# Patient Record
Sex: Male | Born: 1950 | Smoking: Former smoker
Health system: Southern US, Community
[De-identification: ages and names within clinical notes are randomized; demographics above are authoritative.]

## PROBLEM LIST (undated history)

## (undated) DIAGNOSIS — H409 Unspecified glaucoma: Secondary | ICD-10-CM

## (undated) DIAGNOSIS — M179 Osteoarthritis of knee, unspecified: Secondary | ICD-10-CM

## (undated) DIAGNOSIS — F329 Major depressive disorder, single episode, unspecified: Secondary | ICD-10-CM

## (undated) DIAGNOSIS — F32A Depression, unspecified: Secondary | ICD-10-CM

## (undated) DIAGNOSIS — Z9989 Dependence on other enabling machines and devices: Secondary | ICD-10-CM

## (undated) DIAGNOSIS — G4733 Obstructive sleep apnea (adult) (pediatric): Secondary | ICD-10-CM

## (undated) DIAGNOSIS — I10 Essential (primary) hypertension: Secondary | ICD-10-CM

## (undated) DIAGNOSIS — Z96651 Presence of right artificial knee joint: Secondary | ICD-10-CM

## (undated) DIAGNOSIS — E785 Hyperlipidemia, unspecified: Secondary | ICD-10-CM

## (undated) DIAGNOSIS — M1710 Unilateral primary osteoarthritis, unspecified knee: Secondary | ICD-10-CM

## (undated) DIAGNOSIS — K5900 Constipation, unspecified: Secondary | ICD-10-CM

## (undated) HISTORY — DX: Presence of right artificial knee joint: Z96.651

## (undated) HISTORY — DX: Major depressive disorder, single episode, unspecified: F32.9

## (undated) HISTORY — DX: Unspecified glaucoma: H40.9

## (undated) HISTORY — DX: Hyperlipidemia, unspecified: E78.5

## (undated) HISTORY — DX: Dependence on other enabling machines and devices: Z99.89

## (undated) HISTORY — DX: Osteoarthritis of knee, unspecified: M17.9

## (undated) HISTORY — DX: Obstructive sleep apnea (adult) (pediatric): G47.33

## (undated) HISTORY — DX: Depression, unspecified: F32.A

## (undated) HISTORY — DX: Constipation, unspecified: K59.00

## (undated) HISTORY — DX: Essential (primary) hypertension: I10

## (undated) HISTORY — DX: Unilateral primary osteoarthritis, unspecified knee: M17.10

---

## 2013-10-18 DIAGNOSIS — Z9989 Dependence on other enabling machines and devices: Secondary | ICD-10-CM

## 2013-10-18 DIAGNOSIS — G4733 Obstructive sleep apnea (adult) (pediatric): Secondary | ICD-10-CM

## 2013-10-18 HISTORY — DX: Obstructive sleep apnea (adult) (pediatric): G47.33

## 2016-11-25 DIAGNOSIS — Z96651 Presence of right artificial knee joint: Secondary | ICD-10-CM | POA: Insufficient documentation

## 2016-11-25 HISTORY — DX: Presence of right artificial knee joint: Z96.651

## 2016-11-25 HISTORY — PX: TOTAL KNEE ARTHROPLASTY: SHX125

## 2016-11-28 ENCOUNTER — Non-Acute Institutional Stay (SKILLED_NURSING_FACILITY): Payer: Medicare Other | Admitting: Internal Medicine

## 2016-11-28 DIAGNOSIS — Z96651 Presence of right artificial knee joint: Secondary | ICD-10-CM

## 2016-11-28 DIAGNOSIS — I10 Essential (primary) hypertension: Secondary | ICD-10-CM

## 2016-11-28 DIAGNOSIS — M1711 Unilateral primary osteoarthritis, right knee: Secondary | ICD-10-CM | POA: Diagnosis not present

## 2016-11-28 DIAGNOSIS — Z9989 Dependence on other enabling machines and devices: Secondary | ICD-10-CM

## 2016-11-28 DIAGNOSIS — H409 Unspecified glaucoma: Secondary | ICD-10-CM

## 2016-11-28 DIAGNOSIS — F32A Depression, unspecified: Secondary | ICD-10-CM

## 2016-11-28 DIAGNOSIS — F329 Major depressive disorder, single episode, unspecified: Secondary | ICD-10-CM

## 2016-11-28 DIAGNOSIS — G4733 Obstructive sleep apnea (adult) (pediatric): Secondary | ICD-10-CM | POA: Diagnosis not present

## 2016-11-28 DIAGNOSIS — E785 Hyperlipidemia, unspecified: Secondary | ICD-10-CM | POA: Diagnosis not present

## 2016-12-01 ENCOUNTER — Encounter: Payer: Self-pay | Admitting: Internal Medicine

## 2016-12-01 DIAGNOSIS — M1710 Unilateral primary osteoarthritis, unspecified knee: Secondary | ICD-10-CM | POA: Insufficient documentation

## 2016-12-01 DIAGNOSIS — H409 Unspecified glaucoma: Secondary | ICD-10-CM | POA: Insufficient documentation

## 2016-12-01 DIAGNOSIS — F329 Major depressive disorder, single episode, unspecified: Secondary | ICD-10-CM | POA: Insufficient documentation

## 2016-12-01 DIAGNOSIS — F32A Depression, unspecified: Secondary | ICD-10-CM | POA: Insufficient documentation

## 2016-12-01 DIAGNOSIS — M179 Osteoarthritis of knee, unspecified: Secondary | ICD-10-CM | POA: Insufficient documentation

## 2016-12-01 DIAGNOSIS — E785 Hyperlipidemia, unspecified: Secondary | ICD-10-CM | POA: Insufficient documentation

## 2016-12-01 DIAGNOSIS — I10 Essential (primary) hypertension: Secondary | ICD-10-CM | POA: Insufficient documentation

## 2016-12-01 HISTORY — DX: Hyperlipidemia, unspecified: E78.5

## 2016-12-01 HISTORY — DX: Unspecified glaucoma: H40.9

## 2016-12-01 NOTE — Progress Notes (Signed)
: Provider:  Randon Goldsmith. Lyn Hollingshead, MD Location:  [facility] Room Number: 111 Place of Service:  SNF ([phone])  PCP: No primary care provider on file. Patient Care Team: Marguarite Arbour, MD as Consulting Physician (Orthopedic Surgery)  Extended Emergency Contact Information Primary Emergency Contact: Georg [place] Relation: Sister     Allergies: Patient has no known allergies.  Chief Complaint  Patient presents with  . New Admit To SNF    following hospitalization 11/25/16 to 11/28/16 [facility] right total knee arthroplasty, Dr. Barkley Boards    HPI: Patient is 66 y.o. male end-stage osteoarthritis right knee, hypertension, OSA on CPAP, hyperlipidemia, and depression who was admitted to [facility] from 10 9/12 for a planned right total knee arthroplasty secondary to end-stage osteoarthritis of the right knee. Information on patient is extremely sparse practically nothing on the discharge summary and there is no care everywhere. Patient left on postoperative day 3 and is on aspirin twice a day as prophylaxis. Patient is admitted to skilled nursing facility here in [place] because he needs to be near family for OT/PT. While at skilled nursing facility patient will be followed for hyperlipidemia treated with Lipitor hypertension treated with lisinopril and depression treated with Zoloft.  Past Medical History:  Diagnosis Date  . Constipation   . Depression   . Hypertension   . OSA on CPAP 10/2013  . Osteoarthritis of knee   . 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 TOTAL KNEE ARTHROPLASTY Right 11/25/2016   Dr. Barkley Boards    Allergies as of 11/28/2016   Not on File     Medication List       Accurate as of 11/28/16 11:59 PM. Always use your most recent med list.          acetaminophen 325 MG tablet Commonly known as:  TYLENOL Take  325 mg by mouth. Take 2 tablets every 6 hours for pain   aspirin 325 MG tablet Take 325 mg by mouth. Take one tablet twice daily with meals for 6 weeks for osteoarthritis s/p right TKA. Stop 01/09/17   atorvastatin 20 MG tablet Commonly known as:  LIPITOR Take 20 mg by mouth. Take one tablet at bedtime for hyperlipidemia   latanoprost 0.005 % ophthalmic solution Commonly known as:  XALATAN 1 drop. In both eyes at bedtime for glaucoma   lisinopril 40 MG tablet Commonly known as:  PRINIVIL,ZESTRIL Take 40 mg by mouth. Take one tablet daily for hypertension   meloxicam 7.5 MG tablet Commonly known as:  MOBIC Take 7.5 mg by mouth. Take one tablet with lunch for pain/inflammation   oxyCODONE 5 MG immediate release tablet Commonly known as:  Oxy IR/ROXICODONE Take 5 mg by mouth. Take one tablet every 4 hours as needed for mild to moderate pain. Take 2 tablets every 4 hours as needed for severe pain. Stop 12/12/16   senna 8.6 MG Tabs tablet Commonly known as:  SENOKOT Take 1 tablet by mouth. Take 2 tablets twice daily for constipation   sertraline 50 MG tablet Commonly known as:  ZOLOFT Take 50 mg by mouth. Take one tablet daily for depression   timolol 0.5 % ophthalmic solution Commonly known as:  BETIMOL 1 drop. In right eye once daily for glaucoma       Meds ordered this encounter  Medications  . timolol (BETIMOL) 0.5 % ophthalmic solution  Sig: 1 drop. In right eye once daily for glaucoma  . sertraline (ZOLOFT) 50 MG tablet    Sig: Take 50 mg by mouth. Take one tablet daily for depression  . oxyCODONE (OXY IR/ROXICODONE) 5 MG immediate release tablet    Sig: Take 5 mg by mouth. Take one tablet every 4 hours as needed for mild to moderate pain. Take 2 tablets every 4 hours as needed for severe pain. Stop 12/12/16  . meloxicam (MOBIC) 7.5 MG tablet    Sig: Take 7.5 mg by mouth. Take one tablet with lunch for pain/inflammation  . lisinopril (PRINIVIL,ZESTRIL) 40 MG  tablet    Sig: Take 40 mg by mouth. Take one tablet daily for hypertension  . latanoprost (XALATAN) 0.005 % ophthalmic solution    Sig: 1 drop. In both eyes at bedtime for glaucoma  . senna (SENOKOT) 8.6 MG TABS tablet    Sig: Take 1 tablet by mouth. Take 2 tablets twice daily for constipation  . atorvastatin (LIPITOR) 20 MG tablet    Sig: Take 20 mg by mouth. Take one tablet at bedtime for hyperlipidemia  . aspirin 325 MG tablet    Sig: Take 325 mg by mouth. Take one tablet twice daily with meals for 6 weeks for osteoarthritis s/p right TKA. Stop 01/09/17  . acetaminophen (TYLENOL) 325 MG tablet    Sig: Take 325 mg by mouth. Take 2 tablets every 6 hours for pain     There is no immunization history on file for this patient.  Social History  Substance Use Topics  . Smoking status: Former Smoker    Types: Cigarettes  . Smokeless tobacco: Never Used  . Alcohol use No    Family history is   Family History  Problem Relation Age of Onset  . Cancer Mother   . Cancer Father   . Heart disease Maternal Grandmother   . Heart disease Paternal Grandmother       Review of Systems  DATA OBTAINED: from patient GENERAL:  no fevers, fatigue, appetite changes SKIN: No itching, or rash EYES: No eye pain, redness, discharge EARS: No earache, tinnitus, change in hearing NOSE: No congestion, drainage or bleeding  MOUTH/THROAT: No mouth or tooth pain, No sore throat RESPIRATORY: No cough, wheezing, SOB CARDIAC: No chest pain, palpitations, lower extremity edema  GI: No abdominal pain, No N/V/D or constipation, No heartburn or reflux  GU: No dysuria, frequency or urgency, or incontinence  MUSCULOSKELETAL: No unrelieved bone/joint pain NEUROLOGIC: No headache, dizziness or focal weakness PSYCHIATRIC: No c/o anxiety or sadness   Vitals:   11/28/16 1003  BP: 138/74  Pulse: 76  Resp: 19  Temp: 98.4 F (36.9 C)    SpO2 Readings from Last 1 Encounters:  No data found for SpO2   Body  mass index is 37.91 kg/m.     Physical Exam  GENERAL APPEARANCE: Alert, conversant,  No acute distress.  SKIN: No unexpected warmth or erythema; swelling is borderline HEAD: Normocephalic, atraumatic  EYES: Conjunctiva/lids clear. Pupils round, reactive. EOMs intact.  EARS: External exam WNL, canals clear. Hearing grossly normal.  NOSE: No deformity or discharge.  MOUTH/THROAT: Lips w/o lesions  RESPIRATORY: Breathing is even, unlabored. Lung sounds are clear   CARDIOVASCULAR: Heart RRR no murmurs, rubs or gallops. Right lower extremity is greater in size than left lower extremity which is not unusual for status post surgery   GASTROINTESTINAL: Abdomen is soft, non-tender, not distended w/ normal bowel sounds. GENITOURINARY: Bladder non tender, not distended  MUSCULOSKELETAL: No abnormal joints or musculature NEUROLOGIC:  Cranial nerves 2-12 grossly intact. Moves all extremities  PSYCHIATRIC: Mood and affect appropriate to situation, no behavioral issues  Patient Active Problem List   Diagnosis Date Noted  . Osteoarthritis of knee 12/01/2016  . Hypertension 12/01/2016  . Depression 12/01/2016  . Hyperlipidemia 12/01/2016  . Glaucoma 12/01/2016  . S/P total knee arthroplasty, right 11/25/2016  . OSA on CPAP 10/18/2013      Labs reviewed: Basic Metabolic Panel: No results found for: NA, K, CL, CO2, GLUCOSE, BUN, CREATININE, CALCIUM, PROT, ALBUMIN, AST, ALT, ALKPHOS, BILITOT, GFRNONAA, GFRAA  No results for input(s): NA, K, CL, CO2, GLUCOSE, BUN, CREATININE, CALCIUM, MG, PHOS in the last 8760 hours. Liver Function Tests: No results for input(s): AST, ALT, ALKPHOS, BILITOT, PROT, ALBUMIN in the last 8760 hours. No results for input(s): LIPASE, AMYLASE in the last 8760 hours. No results for input(s): AMMONIA in the last 8760 hours. CBC: No results for input(s): WBC, NEUTROABS, HGB, HCT, MCV, PLT in the last 8760 hours. Lipid No results for input(s): CHOL, HDL, LDLCALC, TRIG  in the last 8760 hours.  Cardiac Enzymes: No results for input(s): CKTOTAL, CKMB, CKMBINDEX, TROPONINI in the last 8760 hours. BNP: No results for input(s): BNP in the last 8760 hours. No results found for: MICROALBUR No results found for: HGBA1C No results found for: TSH No results found for: VITAMINB12 No results found for: FOLATE No results found for: IRON, TIBC, FERRITIN  Imaging and Procedures obtained prior to SNF admission: Patient was never admitted.   Not all labs, radiology exams or other studies done during hospitalization come through on my EPIC note; however they are reviewed by me.    Assessment and Plan  IN STAGE OSTEOARTHRITIS RIGHT KNEE/status post right knee arthroplasty; performed [facility] and apparently without any complications. Patient is being prophylaxed with ASA twice a day for 6 weeks SNF - admitted for OT/PT; continue ASA 325 mg by mouth twice a day for 6 weeks as prophylaxis  HYPERTENSION SNF - controlled; plan to continue lisinopril 40 mg by mouth daily  OSA CPAP SNF - patient normally uses his CPAP, and hasn't been brought to him yet by family but is coming  HYPERLIPIDEMIA SNF -not stated as uncontrolled; plan to continue Lipitor 20 mg by mouth daily at bedtime  DEPRESSION SNF - appears very well controlled; continue Zoloft 50 mg by mouth daily  GLAUCOMA SNF - controlled; plan to continue timolol 1 drop daily and xalantan 1 drop each eye daily at bedtime  No problem-specific Assessment & Plan notes found for this encounter.  Time spent greater than 45 minutes due to paucity of information Randon Goldsmith. Lyn Hollingshead, MD

## 2016-12-02 LAB — CBC AND DIFFERENTIAL
HEMATOCRIT: 33 — AB (ref 41–53)
HEMOGLOBIN: 10.9 — AB (ref 13.5–17.5)
PLATELETS: 492 — AB (ref 150–399)
WBC: 8.4

## 2016-12-02 LAB — BASIC METABOLIC PANEL
BUN: 13 (ref 4–21)
CREATININE: 0.8 (ref 0.6–1.3)
Glucose: 106
Potassium: 4.2 (ref 3.4–5.3)
Sodium: 137 (ref 137–147)

## 2016-12-12 ENCOUNTER — Other Ambulatory Visit: Payer: Self-pay

## 2016-12-15 ENCOUNTER — Encounter: Payer: Self-pay | Admitting: Internal Medicine

## 2016-12-15 ENCOUNTER — Non-Acute Institutional Stay (SKILLED_NURSING_FACILITY): Payer: Medicare Other | Admitting: Internal Medicine

## 2016-12-15 DIAGNOSIS — E785 Hyperlipidemia, unspecified: Secondary | ICD-10-CM | POA: Diagnosis not present

## 2016-12-15 DIAGNOSIS — H409 Unspecified glaucoma: Secondary | ICD-10-CM

## 2016-12-15 DIAGNOSIS — Z9989 Dependence on other enabling machines and devices: Secondary | ICD-10-CM

## 2016-12-15 DIAGNOSIS — F329 Major depressive disorder, single episode, unspecified: Secondary | ICD-10-CM | POA: Diagnosis not present

## 2016-12-15 DIAGNOSIS — I10 Essential (primary) hypertension: Secondary | ICD-10-CM

## 2016-12-15 DIAGNOSIS — Z96651 Presence of right artificial knee joint: Secondary | ICD-10-CM

## 2016-12-15 DIAGNOSIS — G4733 Obstructive sleep apnea (adult) (pediatric): Secondary | ICD-10-CM | POA: Diagnosis not present

## 2016-12-15 DIAGNOSIS — M1711 Unilateral primary osteoarthritis, right knee: Secondary | ICD-10-CM | POA: Diagnosis not present

## 2016-12-15 DIAGNOSIS — F32A Depression, unspecified: Secondary | ICD-10-CM

## 2016-12-15 NOTE — Progress Notes (Signed)
Location:  [facility] Room Number: 111 Place of Service:  SNF [phone])  Provider: Randon Goldsmith. Lyn Hollingshead, MD  PCP: No primary care provider on file. Patient Care Team: Marguarite Arbour, MD as Consulting Physician (Orthopedic Surgery)  Extended Emergency Contact Information Primary Emergency Contact: Georg [place]uddle [place] Home Phone: [phone] Relation: Sister  No Known Allergies  Chief Complaint  Patient presents with  . Discharge Note    discharge from SNF to home    HPI:  66 y.o. male  With end-stage osteoarthritis of right knee, hypertension, OSA on CPAP, hyperlipidemia, and depression [facility] a planned right total knee arthroplasty.Information on patient was extremely sparse with nothing on the discharge summary and no care everywhere. But it was noted the patient left on postop day 3 and was on aspirin twice a day as prophylaxis. Patient was admitted to skilled nursing facility for OT/PT in [place] because he needed to be near family. Patient is now ready to be discharged to home.    Past Medical History:  Diagnosis Date  . Constipation   . Depression   . Glaucoma 12/01/2016  . Hyperlipidemia 12/01/2016  . Hypertension   . OSA on CPAP 10/2013  . Osteoarthritis of knee   . 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 TOTAL KNEE ARTHROPLASTY Right 11/25/2016   Dr. Barkley Boards     reports that he has quit smoking. His smoking use included cigarettes. he has never used smokeless tobacco. He reports that he does not drink alcohol or use drugs. Social History   Socioeconomic History  . Marital status: Divorced    Spouse name: Not on file  . Number of children: Not on file  . Years of education: Not on file  . Highest education level: Not on file  Social Needs  . Financial resource strain: Not on file  .  Food insecurity - worry: Not on file  . Food insecurity - inability: Not on file  . Transportation needs - medical: Not on file  . Transportation needs - non-medical: Not on file  Occupational History  . Not on file  Tobacco Use  . Smoking status: Former Smoker    Types: Cigarettes  . Smokeless tobacco: Never Used  Substance and Sexual Activity  . Alcohol use: No  . Drug use: No  . Sexual activity: Not Currently  Other Topics Concern  . Not on file  Social History Narrative   Ad[facility] to [facility] 11/28/16   Divorced   Never smoked   Alcohol none   [employer]   Full code    Pertinent  Health Maintenance Due  Topic Date Due  . COLONOSCOPY  11/09/2000  . PNA vac Low Risk Adult (1 of 2 - PCV13) 11/10/2015  . INFLUENZA VACCINE  09/17/2016    Medications: Allergies as of 12/15/2016   No Known Allergies     Medication List        Accurate as of 12/15/16 11:59 PM. Always use your most recent med list.          acetaminophen 325 MG tablet Commonly known as:  TYLENOL Take 325 mg by mouth. Take 2 tablets every 6 hours for pain   aspirin 325 MG tablet Take 325 mg by mouth. Take one tablet twice daily with meals for 6 weeks for osteoarthritis s/p  right TKA. Stop 01/09/17   atorvastatin 20 MG tablet Commonly known as:  LIPITOR Take 20 mg by mouth. Take one tablet at bedtime for hyperlipidemia   latanoprost 0.005 % ophthalmic solution Commonly known as:  XALATAN 1 drop. In both eyes at bedtime for glaucoma   lisinopril 40 MG tablet Commonly known as:  PRINIVIL,ZESTRIL Take 40 mg by mouth. Take one tablet daily for hypertension   meloxicam 7.5 MG tablet Commonly known as:  MOBIC Take 7.5 mg by mouth. Take one tablet with lunch for pain/inflammation   senna 8.6 MG Tabs tablet Commonly known as:  SENOKOT Take 1 tablet by mouth. Take 2 tablets twice daily for constipation   sertraline 50 MG tablet Commonly known as:  ZOLOFT Take 50 mg by mouth. Take  one tablet daily for depression   timolol 0.5 % ophthalmic solution Commonly known as:  BETIMOL 1 drop. In right eye once daily for glaucoma        Vitals:   12/15/16 1109  BP: 110/60  Pulse: 66  Resp: 18  Temp: 97.6 F (36.4 C)  Weight: 255 lb (115.7 kg)  Height: 5\' 9"  (1.753 m)   Body mass index is 37.66 kg/m.  Physical Exam  GENERAL APPEARANCE: Alert, conversant. No acute distress.  HEENT: Unremarkable. RESPIRATORY: Breathing is even, unlabored. Lung sounds are clear   CARDIOVASCULAR: Heart RRR no murmurs, rubs or gallops. No peripheral edema.  GASTROINTESTINAL: Abdomen is soft, non-tender, not distended w/ normal bowel sounds.  NEUROLOGIC: Cranial nerves 2-12 grossly intact. Moves all extremities   Labs reviewed: Basic Metabolic Panel: Recent Labs    12/02/16  NA 137  K 4.2  BUN 13  CREATININE 0.8   No results found for: [facility] Liver Function Tests: No results for input(s): AST, ALT, ALKPHOS, BILITOT, PROT, ALBUMIN in the last 8760 hours. No results for input(s): LIPASE, AMYLASE in the last 8760 hours. No results for input(s): AMMONIA in the last 8760 hours. CBC: Recent Labs    12/02/16  WBC 8.4  HGB 10.9*  HCT 33*  PLT 492*   Lipid No results for input(s): CHOL, HDL, LDLCALC, TRIG in the last 8760 hours. Cardiac Enzymes: No results for input(s): CKTOTAL, CKMB, CKMBINDEX, TROPONINI in the last 8760 hours. BNP: No results for input(s): BNP in the last 8760 hours. CBG: No results for input(s): GLUCAP in the last 8760 hours.  Procedures and Imaging Studies During Stay: No results found.  Assessment/Plan:   Primary osteoarthritis of right knee  S/P total knee arthroplasty, right  Essential hypertension  OSA on CPAP  Depression, unspecified depression type  Hyperlipidemia, unspecified hyperlipidemia type  Glaucoma of both eyes, unspecified glaucoma type   Patient is being [facility] with the following [facility] services[facility]  [facility]  Patient is being discharged with the following durable medical equipment: none   Patient has been advised to f/u with their PCP in 1-2 weeks to bring them up to date on their rehab stay.  Social services at facility was responsible for arranging this appointment.  Pt was provided with a 30 day supply of prescriptions for medications and refills must be obtained from their PCP.  For controlled substances, a more limited supply may be provided adequate until PCP appointment only.  Medications have been reconciled.  Time spent greater than 30 minutes;> 50% of time with patient was spent reviewing records, labs, tests and studies, counseling and developing plan of care  Randon Goldsmithnne D. Lyn HollingsheadAlexander, MD

## 2016-12-21 ENCOUNTER — Encounter: Payer: Self-pay | Admitting: Internal Medicine

## 2020-03-19 ENCOUNTER — Ambulatory Visit (INDEPENDENT_AMBULATORY_CARE_PROVIDER_SITE_OTHER): Payer: Medicare HMO | Admitting: Neurology

## 2020-03-19 ENCOUNTER — Encounter: Payer: Self-pay | Admitting: Neurology

## 2020-03-19 VITALS — BP 162/76 | HR 50 | Ht 70.0 in | Wt 253.0 lb

## 2020-03-19 DIAGNOSIS — F5104 Psychophysiologic insomnia: Secondary | ICD-10-CM | POA: Diagnosis not present

## 2020-03-19 DIAGNOSIS — R0683 Snoring: Secondary | ICD-10-CM

## 2020-03-19 DIAGNOSIS — I10 Essential (primary) hypertension: Secondary | ICD-10-CM

## 2020-03-19 DIAGNOSIS — M2619 Other specified anomalies of jaw-cranial base relationship: Secondary | ICD-10-CM | POA: Diagnosis not present

## 2020-03-19 DIAGNOSIS — G4733 Obstructive sleep apnea (adult) (pediatric): Secondary | ICD-10-CM

## 2020-03-19 DIAGNOSIS — Z9989 Dependence on other enabling machines and devices: Secondary | ICD-10-CM

## 2020-03-19 NOTE — Progress Notes (Signed)
SLEEP MEDICINE CLINIC    Provider:  Melvyn Novas, MD  Primary Care Physician:  [facility]   Referring Provider: [facility]-  Dr Charlott Holler.        Chief Complaint according to patient   Patient presents with:    . New Patient (Initial Visit)           HISTORY OF PRESENT ILLNESS:  Christian Nicholson is a 70 year old African -American  male patient and seen upon a [place] referral on 03/19/2020.  Chief concern according to patient :  re-evauation of sleep apnea and insomnia. Insomnia ever since childhood.  I could not easily fall asleep ever, not in childhood. 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 [place].   I have the pleasure of seeing [employer] today, a right -handed [facility] patient with OSA sleep disorder.  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 He didn't find supplies - but used his machine.     The patient had the first sleep study in the year 2017 or 18  with a result of an AHI ( Apnea Hypopnea index)  Enough to s diagnosis OSA.  he is using a phillips machine on recall- he is waiting a replacement. Never used a so-clean.  He is interested in a dental device.  I was able to download the DreamStation auto CPAP which the patient has received in 2017 or 2018.  Over the last 30 days he has used the device every day 100% with an average use at time of 8 hours 25 minutes which is excellent.  His CPAP device left him with an average residual AHI of 3.0 which is also very good 90% of the time was used at 10 cm water pressure average air leak was only 9 minutes which means the patient really may have an oral air leak but his nasal device feels very well.  He uses a minimum pressure of 9, a maximum pressure of 15 cmH2O with a flex setting of 3 cm EPR.  His ramp time was 15  minutes before it reaches maximum pressure and he is using a humidifier at stage V.  Sleep relevant medical history:Tonsillectomy. Family medical /sleep history: one sister on CPAP with OSA.   Social history:  Patient is no longer working- retired in 2016-  and lives in a household alone. Family status is divorced with 3 adult children, 6 grandchildren. His son is now in Interior and spatial designer in the [employer].  Pets are not present.Tobacco use: none - quit over 2.5  years ago. ETOH use: beer, seldomly  Caffeine intake in form of Coffee( decaffeinated ) Soda( /) Tea ( seldomly ) or energy drinks. Regular exercise in form of walking,  Hobbies :reading.    Sleep habits are as follows: The patient's VEGETARIAN dinner time is 3 PM- one meal a day. The patient goes to bed at 9  PM and continues to sleep for intervals of 2-3 hours , wakes for a half hour, goes back to sleep. Rarely wakes for bathroom breaks.  He sleeps only 4-6 hours , helped by CPAP. Nasal mask doesn't hinder him to snore.  The preferred sleep position is sideways, with the support of  pillows.  Dreams are reportedly frequent/vivid.  7 AM is the usual rise time. The patient wakes up  spontaneously.   He reports usually feeling refreshed or restored in AM, with symptoms such as dry mouth, but no morning headaches . No Naps are taken.   Review of Systems: Out of a complete 14 system review, the patient complains of only the following symptoms, and all other reviewed systems are negative.:   Sciatica on the left, knee numbness, insomnia.  Fatigue, sleepiness , snoring on CPAP, but less so. Facial hair.   Wants an oral device.  How likely are you to doze in the following situations: 0 = not likely, 1 = slight chance, 2 = moderate chance, 3 = high chance   Sitting and Reading? Watching Television? Sitting inactive in a public place (theater or meeting)? As a passenger in a car for an hour without a break? Lying down in the afternoon when  circumstances permit? Sitting and talking to someone? Sitting quietly after lunch without alcohol? In a car, while stopped for a few minutes in traffic?   Total =  1/ 24 points   FSS endorsed at 12/ 63 points.   Social History   Socioeconomic History  . Marital status: Divorced    Spouse name: Not on file  . Number of children: Not on file  . Years of education: Not on file  . Highest education level: Not on file  Occupational History  . Not on file  Tobacco Use  . Smoking status: Former Smoker    Types: Cigarettes  . Smokeless tobacco: Never Used  Vaping Use  . Vaping Use: Never used  Substance and Sexual Activity  . Alcohol use: No  . Drug use: No  . Sexual activity: Not Currently  Other Topics Concern  . Not on file  Social History Narrative   Admitted to [facility] 11/28/16   Divorced   Never smoked   Alcohol none   [employer]   Full code   Social Determinants of Health   Financial Resource Strain: Not on file  Food Insecurity: Not on file  Transportation Needs: Not on file  Physical Activity: Not on file  Stress: Not on file  Social Connections: Not on file    Family History  Problem Relation Age of Onset  . Cancer Mother   . Cancer Father   . Heart disease Maternal Grandmother   . Heart disease Paternal Grandmother     Past Medical History:  Diagnosis Date  . Constipation   . Depression   . Glaucoma 12/01/2016  . Hyperlipidemia 12/01/2016  . Hypertension   . OSA on CPAP 10/2013  . Osteoarthritis of knee   . 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 TOTAL KNEE ARTHROPLASTY Right 11/25/2016   Dr. Barkley Boards     Current Outpatient Medications on File Prior to Visit  Medication Sig Dispense Refill  . latanoprost (XALATAN) 0.005 % ophthalmic solution 1 drop. In both eyes at bedtime for glaucoma    . timolol (BETIMOL) 0.5 % ophthalmic solution 1 drop. In right eye  once daily for glaucoma    . zolpidem (AMBIEN) 10 MG tablet Take 10 mg by mouth at bedtime as needed for sleep.     No current facility-administered medications on file prior to visit.    No Known Allergies  Physical exam:  Today's Vitals   03/19/20 0959  BP: (!) 162/76  Pulse: (!) 50  Weight: 253 lb (114.8 kg)  Height: 5\' 10"  (1.778 m)  Body mass index is 36.3 kg/m.   Wt Readings from Last 3 Encounters:  03/19/20 253 lb (114.8 kg)  12/15/16 255 lb (115.7 kg)  11/28/16 256 lb 11.2 oz (116.4 kg)     Ht Readings from Last 3 Encounters:  03/19/20 5\' 10"  (1.778 m)  12/15/16 5\' 9"  (1.753 m)  11/28/16 5\' 9"  (1.753 m)      General: The patient is awake, alert and appears not in acute distress. The patient is well groomed. Head: Normocephalic, atraumatic.  Neck is supple. Mallampati 2, small , short uvula, but large tongue and irregular teeth.   neck circumference:19 inches . Nasal airflow is  patent.  Prognathia is seen.  Dental status: poor.  Cardiovascular:  Regular rate and cardiac rhythm by pulse,   without distended neck veins. Respiratory: Lungs are clear to auscultation.  Skin:  Without evidence of ankle edema, or rash. Trunk: The patient's posture is erect.   Neurologic exam : The patient is awake and alert, oriented to place and time.   Memory subjective described as intact.  Attention span & concentration ability appears normal.  Speech is fluent,  without  dysarthria, dysphonia or aphasia.  Mood and affect are appropriate.   Cranial nerves: no loss of smell or taste reported  Pupils are equal and briskly reactive to light. Funduscopic exam deferred.   Extraocular movements in vertical and horizontal planes were intact and without nystagmus. No Diplopia. Visual fields by finger perimetry are intact. Hearing was intact to soft voice and finger rubbing.    Facial sensation intact to fine touch.  Facial motor strength is symmetric and tongue and uvula move  midline.  Neck ROM : rotation, tilt and flexion extension were normal for age and shoulder shrug was symmetrical.    Motor exam:  Symmetric bulk, tone and ROM.   left foot dorsiflexion is weaker- mild foot drop.  Normal tone without cog-wheeling, symmetric grip strength . I can't skip, dance or run . He couldn't walk on his heels.    Sensory:  Fine touch  and vibration were tested and absent on the right kneecap and reduced at the right medial ankle.  Proprioception tested in the upper extremities was normal. He feels the knees are both numbish, and "the left knee felt alien".    Coordination: Rapid alternating movements in the fingers/hands were of normal speed.  The Finger-to-nose maneuver was intact without evidence of ataxia, dysmetria or tremor.   Gait and station: Patient could rise unassisted from a seated position, walked without assistive device.  Stance is of normal width/ base and the patient turned with 3 steps.  Toe and heel walk were deferred.   Deep tendon reflexes: in the  upper and lower extremities are symmetric and intact. Patella reflex is preserved.  Babinski response was deferred .       After spending a total time of 45 minutes face to face and additional time for physical and neurologic examination, review of laboratory studies,  personal review of imaging studies, reports and results of other testing and review of referral information / records as far as provided in visit, I have established the following assessments:  1)  The patient reports some benefit from CPAP treatment for apnea but finds the CPAP cumbersome.  2)   No hypersomnia, and still snoring. He has rather prognathia than retrognathia. No TMJ history.  3)  He recalls he was told his AHI as 11 /h.    My Plan is to  proceed with:  1) HST or PSG  to confirm presence of apnea. He wants not a new CPAP but to qualify , hopefully, for a dental device. He wants to came to the lab for PSG , as he has a well  working sleep aid.   2) we had a long discussion of sleep hygiene, he can go to sleep with a sleep-aid but it doesn't last long enough. He may benefit from more outdoor walking. He reports no insecure feeling when walking on uneven floors.  PS : He reports ongoing trouble with the left knee, sciatic nerve pain and now numbness-   I would like to thank Dr Charlott Holler, MD, for allowing me to meet with and to take care of this pleasant patient.   In short, Constantinos Nasworthy is presenting with insomnia, seasonal and cyclic and with OSA , currently treated with C{PAP that is under recall.   I plan to follow up either personally or through our NP within 3-4 month.   CC: I will share my notes with [facility] provider.   Electronically signed by: Melvyn Novas, MD 03/19/2020 10:10 AM  [facility] Board certified by The [employer] of Sleep Medicine and Diplomate of the [employer] of Sleep Medicine. Board certified In Neurology through the ABPN, Fellow of the [employer] of Neurology. Medical Director of [facility].

## 2020-03-19 NOTE — Patient Instructions (Signed)
Insomnia Insomnia is a sleep disorder that makes it difficult to fall asleep or stay asleep. Insomnia can cause fatigue, low energy, difficulty concentrating, mood swings, and poor performance at work or school. There are three different ways to classify insomnia:  Difficulty falling asleep.  Difficulty staying asleep.  Waking up too early in the morning. Any type of insomnia can be long-term (chronic) or short-term (acute). Both are common. Short-term insomnia usually lasts for three months or less. Chronic insomnia occurs at least three times a week for longer than three months. What are the causes? Insomnia may be caused by another condition, situation, or substance, such as:  Anxiety.  Certain medicines.  Gastroesophageal reflux disease (GERD) or other gastrointestinal conditions.  Asthma or other breathing conditions.  Restless legs syndrome, sleep apnea, or other sleep disorders.  Chronic pain.  Menopause.  Stroke.  Abuse of alcohol, tobacco, or illegal drugs.  Mental health conditions, such as depression.  Caffeine.  Neurological disorders, such as Alzheimer's disease.  An overactive thyroid (hyperthyroidism). Sometimes, the cause of insomnia may not be known. What increases the risk? Risk factors for insomnia include:  Gender. Women are affected more often than men.  Age. Insomnia is more common as you get older.  Stress.  Lack of exercise.  Irregular work schedule or working night shifts.  Traveling between different time zones.  Certain medical and mental health conditions. What are the signs or symptoms? If you have insomnia, the main symptom is having trouble falling asleep or having trouble staying asleep. This may lead to other symptoms, such as:  Feeling fatigued or having low energy.  Feeling nervous about going to sleep.  Not feeling rested in the morning.  Having trouble concentrating.  Feeling irritable, anxious, or depressed. How  is this diagnosed? This condition may be diagnosed based on:  Your symptoms and medical history. Your health care provider may ask about: ? Your sleep habits. ? Any medical conditions you have. ? Your mental health.  A physical exam. How is this treated? Treatment for insomnia depends on the cause. Treatment may focus on treating an underlying condition that is causing insomnia. Treatment may also include:  Medicines to help you sleep.  Counseling or therapy.  Lifestyle adjustments to help you sleep better. Follow these instructions at home: Eating and drinking  Limit or avoid alcohol, caffeinated beverages, and cigarettes, especially close to bedtime. These can disrupt your sleep.  Do not eat a large meal or eat spicy foods right before bedtime. This can lead to digestive discomfort that can make it hard for you to sleep.   Sleep habits  Keep a sleep diary to help you and your health care provider figure out what could be causing your insomnia. Write down: ? When you sleep. ? When you wake up during the night. ? How well you sleep. ? How rested you feel the next day. ? Any side effects of medicines you are taking. ? What you eat and drink.  Make your bedroom a dark, comfortable place where it is easy to fall asleep. ? Put up shades or blackout curtains to block light from outside. ? Use a white noise machine to block noise. ? Keep the temperature cool.  Limit screen use before bedtime. This includes: ? Watching TV. ? Using your smartphone, tablet, or computer.  Stick to a routine that includes going to bed and waking up at the same times every day and night. This can help you fall asleep faster. Consider   making a quiet activity, such as reading, part of your nighttime routine.  Try to avoid taking naps during the day so that you sleep better at night.  Get out of bed if you are still awake after 15 minutes of trying to sleep. Keep the lights down, but try reading or  doing a quiet activity. When you feel sleepy, go back to bed.   General instructions  Take over-the-counter and prescription medicines only as told by your health care provider.  Exercise regularly, as told by your health care provider. Avoid exercise starting several hours before bedtime.  Use relaxation techniques to manage stress. Ask your health care provider to suggest some techniques that may work well for you. These may include: ? Breathing exercises. ? Routines to release muscle tension. ? Visualizing peaceful scenes.  Make sure that you drive carefully. Avoid driving if you feel very sleepy.  Keep all follow-up visits as told by your health care provider. This is important. Contact a health care provider if:  You are tired throughout the day.  You have trouble in your daily routine due to sleepiness.  You continue to have sleep problems, or your sleep problems get worse. Get help right away if:  You have serious thoughts about hurting yourself or someone else. If you ever feel like you may hurt yourself or others, or have thoughts about taking your own life, get help right away. You can go to your nearest emergency department or call:  Your local emergency services (911 in the [place]  A suicide crisis helpline, such as the National Suicide Prevention Lifeline at 304 246 2104. This is open 24 hours a day. Summary  Insomnia is a sleep disorder that makes it difficult to fall asleep or stay asleep.  Insomnia can be long-term (chronic) or short-term (acute).  Treatment for insomnia depends on the cause. Treatment may focus on treating an underlying condition that is causing insomnia.  Keep a sleep diary to help you and your health care provider figure out what could be causing your insomnia. This information is not intended to replace advice given to you by your health care provider. Make sure you discuss any questions you have with your health care provider. Document  Revised: 12/15/2019 Document Reviewed: 12/15/2019 Elsevier Patient Education  2021 Elsevier Inc. Sleep Apnea Sleep apnea is a condition in which breathing pauses or becomes shallow during sleep. Episodes of sleep apnea usually last 10 seconds or longer, and they may occur as many as 20 times an hour. Sleep apnea disrupts your sleep and keeps your body from getting the rest that it needs. This condition can increase your risk of certain health problems, including:  Heart attack.  Stroke.  Obesity.  Diabetes.  Heart failure.  Irregular heartbeat. What are the causes? There are three kinds of sleep apnea:  Obstructive sleep apnea. This kind is caused by a blocked or collapsed airway.  Central sleep apnea. This kind happens when the part of the brain that controls breathing does not send the correct signals to the muscles that control breathing.  Mixed sleep apnea. This is a combination of obstructive and central sleep apnea. The most common cause of this condition is a collapsed or blocked airway. An airway can collapse or become blocked if:  Your throat muscles are abnormally relaxed.  Your tongue and tonsils are larger than normal.  You are overweight.  Your airway is smaller than normal.   What increases the risk? You are more likely to develop  this condition if you:  Are overweight.  Smoke.  Have a smaller than normal airway.  Are elderly.  Are male.  Drink alcohol.  Take sedatives or tranquilizers.  Have a family history of sleep apnea. What are the signs or symptoms? Symptoms of this condition include:  Trouble staying asleep.  Daytime sleepiness and tiredness.  Irritability.  Loud snoring.  Morning headaches.  Trouble concentrating.  Forgetfulness.  Decreased interest in sex.  Unexplained sleepiness.  Mood swings.  Personality changes.  Feelings of depression.  Waking up often during the night to urinate.  Dry mouth.  Sore  throat. How is this diagnosed? This condition may be diagnosed with:  A medical history.  A physical exam.  A series of tests that are done while you are sleeping (sleep study). These tests are usually done in a sleep lab, but they may also be done at home. How is this treated? Treatment for this condition aims to restore normal breathing and to ease symptoms during sleep. It may involve managing health issues that can affect breathing, such as high blood pressure or obesity. Treatment may include:  Sleeping on your side.  Using a decongestant if you have nasal congestion.  Avoiding the use of depressants, including alcohol, sedatives, and narcotics.  Losing weight if you are overweight.  Making changes to your diet.  Quitting smoking.  Using a device to open your airway while you sleep, such as: ? An oral appliance. This is a custom-made mouthpiece that shifts your lower jaw forward. ? A continuous positive airway pressure (CPAP) device. This device blows air through a mask when you breathe out (exhale). ? A nasal expiratory positive airway pressure (EPAP) device. This device has valves that you put into each nostril. ? A bi-level positive airway pressure (BPAP) device. This device blows air through a mask when you breathe in (inhale) and breathe out (exhale).  Having surgery if other treatments do not work. During surgery, excess tissue is removed to create a wider airway. It is important to get treatment for sleep apnea. Without treatment, this condition can lead to:  High blood pressure.  Coronary artery disease.  In men, an inability to achieve or maintain an erection (impotence).  Reduced thinking abilities.   Follow these instructions at home: Lifestyle  Make any lifestyle changes that your health care provider recommends.  Eat a healthy, well-balanced diet.  Take steps to lose weight if you are overweight.  Avoid using depressants, including alcohol,  sedatives, and narcotics.  Do not use any products that contain nicotine or tobacco, such as cigarettes, e-cigarettes, and chewing tobacco. If you need help quitting, ask your health care provider. General instructions  Take over-the-counter and prescription medicines only as told by your health care provider.  If you were given a device to open your airway while you sleep, use it only as told by your health care provider.  If you are having surgery, make sure to tell your health care provider you have sleep apnea. You may need to bring your device with you.  Keep all follow-up visits as told by your health care provider. This is important. Contact a health care provider if:  The device that you received to open your airway during sleep is uncomfortable or does not seem to be working.  Your symptoms do not improve.  Your symptoms get worse. Get help right away if:  You develop: ? Chest pain. ? Shortness of breath. ? Discomfort in your back, arms, or  stomach.  You have: ? Trouble speaking. ? Weakness on one side of your body. ? Drooping in your face. These symptoms may represent a serious problem that is an emergency. Do not wait to see if the symptoms will go away. Get medical help right away. Call your local emergency services (911 in the [place] Do not drive yourself to the hospital. Summary  Sleep apnea is a condition in which breathing pauses or becomes shallow during sleep.  The most common cause is a collapsed or blocked airway.  The goal of treatment is to restore normal breathing and to ease symptoms during sleep. This information is not intended to replace advice given to you by your health care provider. Make sure you discuss any questions you have with your health care provider. Document Revised: 07/21/2018 Document Reviewed: 09/29/2017 Elsevier Patient Education  2021 [employer].

## 2020-04-02 ENCOUNTER — Other Ambulatory Visit: Payer: Self-pay

## 2020-04-02 ENCOUNTER — Ambulatory Visit (INDEPENDENT_AMBULATORY_CARE_PROVIDER_SITE_OTHER): Payer: Medicare HMO | Admitting: Neurology

## 2020-04-02 DIAGNOSIS — F5104 Psychophysiologic insomnia: Secondary | ICD-10-CM

## 2020-04-02 DIAGNOSIS — G4761 Periodic limb movement disorder: Secondary | ICD-10-CM

## 2020-04-02 DIAGNOSIS — G4733 Obstructive sleep apnea (adult) (pediatric): Secondary | ICD-10-CM

## 2020-04-02 DIAGNOSIS — M2619 Other specified anomalies of jaw-cranial base relationship: Secondary | ICD-10-CM

## 2020-04-02 DIAGNOSIS — I10 Essential (primary) hypertension: Secondary | ICD-10-CM

## 2020-04-02 DIAGNOSIS — R0683 Snoring: Secondary | ICD-10-CM

## 2020-04-09 NOTE — Addendum Note (Signed)
Addended by: Melvyn Novas on: 04/09/2020 01:17 PM   Modules accepted: Orders

## 2020-04-09 NOTE — Progress Notes (Signed)
[facility] patient of Dr Charlott Holler, MD : 1. Moderate degree of Obstructive Sleep Apnea (OSA) was confirmed and associated with only 10 minutes of hypoxemia. This apnea was mostly dependent on supine sleep position and was exacerbated by REM sleep, but not REM sleep dependent.  2. 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  3. Poor overall sleep efficiency.    RECOMMENDATIONS:  1. I would prefer a full-night, attended, CPAP titration study to optimize therapy and to document the effect on PLMs, a new machine an then be ordered for this patient. Patient has stated he is not in favor of further CPAP and wants to try a dental device instead.  2. The patient needs to avoid supine sleep as much as possible.   3. Weight loss will also support a reduction in AHI. An inspire device is not indicated due to BMI exclusion.  4. Hypoxemia was limited and should not require oxygen supplementation. 5. A dental device can also be used to improve the is sleep apnea degree and snoring. The patient voiced an interest in this treatment modality above all else and we can refer him to a sleep dentist.

## 2020-04-09 NOTE — Procedures (Signed)
PATIENT'S NAME:  Christian Nicholson, Christian Nicholson DOB:      21-Dec-1950      MR#:    [id number]     DATE OF RECORDING: 04/02/2020 REFERRING M.D.:  Dr. Charlott Holler at [facility] Performed:   Baseline Polysomnogram HISTORY:  I have the pleasure of seeing Christian Nicholson today, a right -handed [facility] with OSA sleep disorder.  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 He didn't find supplies - but used his machine.    The patient had the first sleep study in the year 2017 or 18 with a diagnosis of OSA.  He is using a Philips machine on recall- he is awaiting a replacement. Never used a so-clean.  He is interested in a dental device.  I was able to download the DreamStation auto CPAP which the patient has received in 2017 or 2018.   Over the last 30 days he has used the device every day 100% with an average use at time of 8 hours 25 minutes which is excellent.  His CPAP device left him with an average residual AHI of 3.0/h, which is also very good. 90% of the time was used at 10 cm water pressure average air leak was only 9 minutes which means the patient really may have an oral air leak but his nasal device feels very well.  He uses a minimum pressure of 9, a maximum pressure of 15 cmH2O with a flex setting of 3 cm EPR.  His ramp time was 15 minutes before it reaches maximum pressure and he is using a humidifier at stage V. The patient endorsed the Epworth Sleepiness Scale at 1 points.   The patient's weight 253 pounds with a height of 70 (inches), resulting in a BMI of 36.3 kg/m2.   CURRENT MEDICATIONS: Ambien, Betimol, Xalatan   PROCEDURE:  This is a multichannel digital polysomnogram utilizing the Somnostar 11.2 system.  Electrodes and sensors were applied and monitored per AASM Specifications.   EEG, EOG, Chin  and Limb EMG, were sampled at 200 Hz.  ECG, Snore and Nasal Pressure, Thermal Airflow, Respiratory Effort, CPAP Flow and Pressure, Oximetry was sampled at 50 Hz. Digital video and audio were recorded.      BASELINE STUDY: Lights Out was at 21:39 and Lights On at 05:09.  Total recording time (TRT) was 450.5 minutes, with a total sleep time (TST) of 288 minutes.   The patient's sleep latency was 64.5 minutes.  REM latency was 124.5 minutes.  The sleep efficiency was 63.9 %.     SLEEP ARCHITECTURE: WASO (Wake after sleep onset) was 102.5 minutes.  There were 74.5 minutes in Stage N1, 151 minutes Stage N2, 0 minutes Stage N3 and 62.5 minutes in Stage REM.  The percentage of Stage N1 was 25.9%, Stage N2 was 52.4%, Stage N3 was 0% and Stage R (REM sleep) was 21.7%.    RESPIRATORY ANALYSIS:  There were a total of 102 respiratory events:  21 obstructive apneas, 0 central apneas and 81 hypopneas.     The total APNEA/HYPOPNEA INDEX (AHI) was 21.25/hour.  36 events occurred in REM sleep and 98 events in NREM. The REM AHI was 34.6 /hour, versus a non-REM AHI of 17.6. The patient spent 71 minutes of total sleep time in the supine position and 217 minutes in non-supine. The supine AHI was 45.6/h versus a  non-supine AHI of 13.3/h.  OXYGEN SATURATION & C02:  The Wake baseline 02 saturation was 92%, with the lowest being 81%. Time spent below 89% saturation equaled 10 minutes. The arousals were noted as: 18 were spontaneous, 10 were associated with PLMs, 28 were associated with respiratory events.  PERIODIC LIMB MOVEMENTS:   The patient had a total of 316 Periodic Limb Movements.  The Periodic Limb Movement (PLM) Arousal index was mild - moderate at 2.1/hour. Audio and video analysis did not show any abnormal or unusual movements, behaviors, phonations or vocalizations.   Snoring was noted. EKG was in keeping with sinus rhythm (NSR), trending to bradycardia.   IMPRESSION:  1. Moderate degree of Obstructive Sleep  Apnea (OSA) was confirmed and associated with only 10 minutes of hypoxemia. This apnea was mostly dependent on supine sleep position and was exacerbated by REM sleep, but not REM sleep dependent.  2. 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  3. Poor overall sleep efficiency.    RECOMMENDATIONS:  1. Advise full-night, attended, CPAP titration study to optimize therapy and to document the effect on PLMs, a new machine will be ordered for this patient. 2. The patient needs to avoid supine sleep as much as possible.   3. Weight loss will also support a reduction in AHI. An inspire device is not indicated due to BMI exclusion.  4. Hypoxemia was limited and should not require oxygen supplementation. 5. A dental device can also be used to improve the is sleep apnea degree and snoring. The patient voiced an interest in this treatment modality above all else and we can refer him to a sleep dentist.     I certify that I have reviewed the entire raw data recording prior to the issuance of this report in accordance with the Standards of Accreditation of the American Academy of Sleep Medicine (AASM)   Melvyn Novas, MD Diplomat, American Board of Psychiatry and Neurology  Diplomat, American Board of Sleep Medicine Wellsite geologist, [place] [facility]

## 2020-04-11 ENCOUNTER — Telehealth: Payer: Self-pay | Admitting: Neurology

## 2020-04-11 NOTE — Telephone Encounter (Signed)
Called patient to discuss sleep study results. No answer at this time. LVM for the patient to call back.   

## 2020-04-11 NOTE — Telephone Encounter (Signed)
-----   Message from Melvyn Novas, MD sent at 04/09/2020  1:17 PM EST ----- [facility] Charlott Holler, MD : 1. Moderate degree of Obstructive Sleep Apnea (OSA) was confirmed and associated with only 10 minutes of hypoxemia. This apnea was mostly dependent on supine sleep position and was exacerbated by REM sleep, but not REM sleep dependent.  2. 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  3. Poor overall sleep efficiency.    RECOMMENDATIONS:  1. I would prefer a full-night, attended, CPAP titration study to optimize therapy and to document the effect on PLMs, a new machine an then be ordered for this patient. Patient has stated he is not in favor of further CPAP and wants to try a dental device instead.  2. The patient needs to avoid supine sleep as much as possible.   3. Weight loss will also support a reduction in AHI. An inspire device is not indicated due to BMI exclusion.  4. Hypoxemia was limited and should not require oxygen supplementation. 5. A dental device can also be used to improve the is sleep apnea degree and snoring. The patient voiced an interest in this treatment modality above all else and we can refer him to a sleep dentist.

## 2020-04-16 ENCOUNTER — Other Ambulatory Visit: Payer: Self-pay | Admitting: Neurology

## 2020-04-16 DIAGNOSIS — G4733 Obstructive sleep apnea (adult) (pediatric): Secondary | ICD-10-CM

## 2020-04-16 DIAGNOSIS — Z9989 Dependence on other enabling machines and devices: Secondary | ICD-10-CM

## 2020-04-16 NOTE — Telephone Encounter (Signed)
I called pt. I advised pt that Dr. Dohmeier reviewed their sleep study results and found that has moderate sleep apnea and recommends that pt be treated with a cpap. Dr. Dohmeier recommends that pt return for a repeat sleep study in order to properly titrate the cpap and ensure a good mask fit. Pt is agreeable to returning for a titration study. I advised pt that our sleep lab will file with pt's insurance and call pt to schedule the sleep study when we hear back from the pt's insurance regarding coverage of this sleep study. Pt verbalized understanding of results. Pt had no questions at this time but was encouraged to call back if questions arise.  .   

## 2020-04-19 ENCOUNTER — Telehealth: Payer: Self-pay

## 2020-04-19 NOTE — Telephone Encounter (Signed)
Calling patient to schedule CPAP titration.

## 2020-04-30 ENCOUNTER — Ambulatory Visit (INDEPENDENT_AMBULATORY_CARE_PROVIDER_SITE_OTHER): Payer: Medicare HMO | Admitting: Neurology

## 2020-04-30 ENCOUNTER — Other Ambulatory Visit: Payer: Self-pay

## 2020-04-30 DIAGNOSIS — Z9989 Dependence on other enabling machines and devices: Secondary | ICD-10-CM

## 2020-04-30 DIAGNOSIS — G4761 Periodic limb movement disorder: Secondary | ICD-10-CM

## 2020-04-30 DIAGNOSIS — G4733 Obstructive sleep apnea (adult) (pediatric): Secondary | ICD-10-CM | POA: Diagnosis not present

## 2020-04-30 DIAGNOSIS — G4739 Other sleep apnea: Secondary | ICD-10-CM

## 2020-04-30 DIAGNOSIS — F5104 Psychophysiologic insomnia: Secondary | ICD-10-CM

## 2020-05-09 DIAGNOSIS — G4733 Obstructive sleep apnea (adult) (pediatric): Secondary | ICD-10-CM | POA: Insufficient documentation

## 2020-05-09 DIAGNOSIS — G4739 Other sleep apnea: Secondary | ICD-10-CM | POA: Insufficient documentation

## 2020-05-09 DIAGNOSIS — G4761 Periodic limb movement disorder: Secondary | ICD-10-CM | POA: Insufficient documentation

## 2020-05-09 NOTE — Procedures (Signed)
PATIENT'S NAME:  Brennen, Camper DOB:      1950-11-09      MR#:    [id number]     DATE OF RECORDING: 04/30/2020 Charlesetta Ivory REFERRING M.D.:  Dr. Charlott Holler, [facility] Performed: Titration to PAP therapy, BiPAP  HISTORY:  I have the pleasure of seeing [employer], a right -handed [facility] patient with a known OSA sleep disorder.  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 He didn't find supplies - but used his machine.   The patient had the first sleep study in the year 2017 or -18 with a diagnosis of OSA.  He is using a Philips machine on recall- he is awaiting a replacement. He returned after PSG from 04-02-2020 documented a moderate degree of OSA and hypoxemia, moderate PLM disorder, and poor sleep efficiency.  The total APNEA/HYPOPNEA INDEX (AHI) was 21.25/hour. The REM AHI was 34.6 /hour, supine AHI was 45.6/h versus a non-supine AHI of 13.3/h.   The patient endorsed the Epworth Sleepiness Scale at 1 point.   The patient's weight 253 pounds with a height of 70 (inches), resulting in a BMI of 36.3 kg/m2. The patient's neck circumference measured 19 inches.  CURRENT MEDICATIONS: Ambien at bedtime, Betimol, Xalatan, Tylenol for leg pain at night.    PROCEDURE:  This is a multichannel digital polysomnogram utilizing the SomnoStar 11.2 system.  Electrodes and sensors were applied and monitored per AASM Specifications.   EEG, EOG, Chin and Limb EMG, were sampled at 200 Hz.  ECG, Snore and Nasal Pressure, Thermal Airflow, Respiratory Effort, CPAP Flow and Pressure, Oximetry was sampled at 50 Hz. Digital video and audio were recorded.       CPAP was initiated with a ResMed F30 I, in medium size, at 8 cmH20 with heated humidity per AASM standards and CPAP pressure was advanced to 13 cmH20, AHI remained  68/h(!), switch to BiPAP was performed and the patient tolerated the higher pressures beautifully- starting at 14/10 cm and reaching 21/16 cm water BiPAP-., the last explored pressure allowed for an AHI of 0.0/h and 107 minutes.  Lights Out was at 21:27 and Lights On at 04:33. Total recording time (TRT) was 427 minutes, with a total sleep time (TST) of 226 minutes. The patient's sleep latency was 67 minutes. REM latency was 187.5 minutes.  The sleep efficiency was 52.9 %. Sleep efficiency improved on BiPAP.     SLEEP ARCHITECTURE: WASO (Wake after sleep onset) was 187.5 minutes.  There were 97.5 minutes in Stage N1, 88.5 minutes Stage N2, 14 minutes Stage N3 and 26 minutes in Stage REM.  The percentage of Stage N1 was 43.1%, Stage N2 was 39.2%, Stage N3 was 6.2% and Stage R (REM sleep) was 11.5%. The sleep architecture was notable for REM rebound.    RESPIRATORY ANALYSIS:  There was a total of 76 respiratory events: 0 obstructive apneas, 4 central apneas and 72 hypopneas.  The total APNEA/HYPOPNEA INDEX (AHI) was 20.2 /hour. 3 events occurred in REM sleep and 73 events in NREM. The REM AHI was 6.9 /hour versus a non-REM AHI of 21.9 /hour.  The patient spent 110 minutes of total sleep time in the supine position and 116 minutes in non-supine. The supine AHI was 31.7, versus a non-supine AHI of 9.3.  OXYGEN SATURATION & C02:  The baseline 02 saturation was 97%,  with the lowest being 88%. Time spent below 89% saturation equaled 0.5 minutes. The arousals were noted as: 28 were spontaneous, 1 associated with PLMs, 43 were associated with respiratory events. The patient had a total of 158 Periodic Limb Movements. The Periodic Limb Movement (PLM) Arousal index was 0.3 /hour. PLMs decreased significantly under BiPAP, there were none seen after midnight.  Audio and video analysis did not show any abnormal or unusual movements, behaviors, phonations or vocalizations. The patient reported severe leg pain, having  been for chiropractic care earlier that day.   The patient took one bathroom break. EKG was in keeping with normal sinus rhythm (NSR).   DIAGNOSIS 1. Central Sleep Apnea and hypopnea worsened under CPAP and improved under high pressures of BiPAP. Finally reaching 21/16 cm water BiPAP under a ResMed F 30 I medium FFM. Marland Kitchen  2. Sleep Related Hypoxemia resolved under PAP therapy. 3. Periodic Limb Movement Disorder resolved under BiPAP.     PLANS/RECOMMENDATIONS: ResMed F30 I, in medium size, under 21/16 cm water BiPAP-., this last explored pressure allowed for an AHI of 0.0/h and 107 minutes of sleep.  1. Any apnea patient should avoid sedatives, hypnotics, and alcohol consumption before bedtime. 2. CPAP therapy compliance is defined as 4 hours or more of nightly use.    DISCUSSION: A follow up appointment will be scheduled in the Sleep Clinic at [facility]. Please call [phone] with any questions.      I certify that I have reviewed the entire raw data recording prior to the issuance of this report in accordance with the Standards of Accreditation of the American Academy of Sleep Medicine (AASM)   Melvyn Novas, M.D. Diplomat, Biomedical engineer of Psychiatry and Neurology  Diplomat, Biomedical engineer of Sleep Medicine Wellsite geologist, [employer] [employer]

## 2020-05-09 NOTE — Addendum Note (Signed)
Addended by: Melvyn Novas on: 05/09/2020 04:49 PM   Modules accepted: Orders

## 2020-05-09 NOTE — Progress Notes (Addendum)
[facility], Dr Charlott Holler, MD    PATIENT'S NAME:  Christian Nicholson, Christian Nicholson DOB:      January 23, 1951      MR#:    [id number]     DATE OF RECORDING: 04/30/2020 Charlesetta Ivory REFERRING M.D.:  Dr. Charlott Holler, [facility] Performed: Titration to PAP  HISTORY:  I have the pleasure of seeing [employer], a right -handed [facility] patient with a known OSA sleep disorder.  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 He didn't find supplies - but used his machine.   The patient had the first sleep study in the year 2017 or -18 with a diagnosis of OSA.  He is using a Philips machine on recall- he is awaiting a replacement. He returned after PSG from 04-02-2020 documented a moderate degree of OSA and hypoxemia, moderate PLM disorder, and poor sleep efficiency.  The total APNEA/HYPOPNEA INDEX (AHI) was 21.25/hour. The REM AHI was 34.6 /hour, supine AHI was 45.6/h versus a non-supine AHI of 13.3/h.   The patient endorsed the Epworth Sleepiness Scale at 1 point.   The patient's weight 253 pounds with a height of 70 (inches), resulting in a BMI of 36.3 kg/m2. The patient's neck circumference measured 19 inches.  CURRENT MEDICATIONS: Ambien at bedtime, Betimol, Xalatan, Tylenol for leg pain at night.    PROCEDURE:  This is a multichannel digital polysomnogram utilizing the SomnoStar 11.2 system.  Electrodes and sensors were applied and monitored per AASM Specifications.   EEG, EOG, Chin and Limb EMG, were sampled at 200 Hz.  ECG, Snore and Nasal Pressure, Thermal Airflow, Respiratory Effort, CPAP Flow and Pressure, Oximetry was sampled at 50 Hz. Digital video and audio were recorded.       CPAP was initiated with a ResMed F30 I, in medium size, at 8 cmH20 with heated humidity per AASM standards and CPAP pressure was advanced to 13  cmH20, AHI remained 68/h(!), switch to BiPAP was performed and the patient tolerated the higher pressures beautifully- starting at 14/10 cm and reaching 21/16 cm water BiPAP-., the last explored pressure allowed for an AHI of 0.0/h and 107 minutes.  Lights Out was at 21:27 and Lights On at 04:33. Total recording time (TRT) was 427 minutes, with a total sleep time (TST) of 226 minutes. The patient's sleep latency was 67 minutes. REM latency was 187.5 minutes.  The sleep efficiency was 52.9 %. Sleep efficiency improved on BiPAP.     SLEEP ARCHITECTURE: WASO (Wake after sleep onset) was 187.5 minutes.  There were 97.5 minutes in Stage N1, 88.5 minutes Stage N2, 14 minutes Stage N3 and 26 minutes in Stage REM.  The percentage of Stage N1 was 43.1%, Stage N2 was 39.2%, Stage N3 was 6.2% and Stage R (REM sleep) was 11.5%. The sleep architecture was notable for REM rebound.    RESPIRATORY ANALYSIS:  There was a total of 76 respiratory events: 0 obstructive apneas, 4 central apneas and 72 hypopneas.  The total APNEA/HYPOPNEA INDEX (AHI) was 20.2 /hour. 3 events occurred in REM sleep and 73 events in NREM. The REM AHI was 6.9 /hour versus a non-REM AHI of 21.9 /hour.  The patient spent 110 minutes of total sleep time in the supine position and 116 minutes in non-supine. The supine AHI was 31.7, versus a non-supine AHI of 9.3.  OXYGEN SATURATION & C02:  The baseline 02 saturation was 97%, with the lowest being 88%. Time spent below 89% saturation equaled 0.5 minutes. The arousals were noted as: 28 were spontaneous, 1 associated with PLMs, 43 were associated with respiratory events. The patient had a total of 158 Periodic Limb Movements. The Periodic Limb Movement (PLM) Arousal index was 0.3 /hour. PLMs decreased significantly under BiPAP, there were none seen after midnight.  Audio and video analysis did not show any abnormal or unusual movements, behaviors, phonations or vocalizations. The patient reported  severe leg pain, having been for chiropractic care earlier that day.   The patient took one bathroom break. EKG was in keeping with normal sinus rhythm (NSR).   DIAGNOSIS 1. Central Sleep Apnea and hypopnea worsened under CPAP and improved under high pressures of BiPAP. Finally reaching 21/16 cm water BiPAP under a ResMed F 30 I medium FFM. Marland Kitchen  2. Sleep Related Hypoxemia resolved under PAP therapy. 3. Periodic Limb Movement Disorder resolved under BiPAP.     PLANS/RECOMMENDATIONS: ResMed F30 I, in medium size, under 21/16 cm water BiPAP-., this last explored pressure allowed for an AHI of 0.0/h and 107 minutes of sleep.  1. Any apnea patient should avoid sedatives, hypnotics, and alcohol consumption before bedtime. 2. CPAP therapy compliance is defined as 4 hours or more of nightly use.    DISCUSSION: A follow up appointment will be scheduled in the Sleep Clinic at [facility]. Please call [phone] with any questions.      I certify that I have reviewed the entire raw data recording prior to the issuance of this report in accordance with the Standards of Accreditation of the American Academy of Sleep Medicine (AASM)   Melvyn Novas, M.D. Diplomat, Biomedical engineer of Psychiatry and Neurology  Diplomat, Biomedical engineer of Sleep Medicine Wellsite geologist, [employer] [facility] at [facility]       DIAGNOSIS 1. Central Sleep Apnea and hypopnea worsened under CPAP and improved under high pressures of BiPAP. Finally reaching 21/16 cm water BiPAP under a ResMed F 30 I medium FFM. Marland Kitchen  2. Sleep Related Hypoxemia resolved under PAP therapy. 3. Periodic Limb Movement Disorder resolved under BiPAP.     PLANS/RECOMMENDATIONS: ResMed F30 I, in medium size, under 21/16 cm water BiPAP-., this last explored pressure allowed for an AHI of 0.0/h and 107 minutes of sleep.  1. Any apnea patient should avoid sedatives, hypnotics, and alcohol consumption before bedtime. 2. CPAP therapy  compliance is defined as 4 hours or more of nightly use.    DISCUSSION: A follow up appointment will be scheduled in the Sleep Clinic at [facility]. Please call [phone] with any questions.

## 2020-05-10 ENCOUNTER — Telehealth: Payer: Self-pay | Admitting: Neurology

## 2020-05-10 NOTE — Telephone Encounter (Signed)
Called patient to discuss sleep study results. No answer at this time. LVM for the patient to call back.   

## 2020-05-10 NOTE — Telephone Encounter (Signed)
-----   Message from Melvyn Novas, MD sent at 05/09/2020  4:47 PM EDT ----- [facility], Dr Charlott Holler, MD   DIAGNOSIS 1. Central Sleep Apnea and hypopnea worsened under CPAP and improved under high pressures of BiPAP. Finally reaching 21/16 cm water BiPAP under a ResMed F 30 I medium FFM. Marland Kitchen  2. Sleep Related Hypoxemia resolved under PAP therapy. 3. Periodic Limb Movement Disorder resolved under BiPAP.     PLANS/RECOMMENDATIONS: ResMed F30 I, in medium size, under 21/16 cm water BiPAP-., this last explored pressure allowed for an AHI of 0.0/h and 107 minutes of sleep.  1. Any apnea patient should avoid sedatives, hypnotics, and alcohol consumption before bedtime. 2. CPAP therapy compliance is defined as 4 hours or more of nightly use.    DISCUSSION: A follow up appointment will be scheduled in the Sleep Clinic at [facility]. Please call [phone] with any questions.

## 2020-05-10 NOTE — Telephone Encounter (Signed)
Pt returned call. I advised pt that Dr. Vickey Huger reviewed their sleep study results and found that pt best tolerated BiPAP. Dr. Vickey Huger recommends that pt starts BiPAP 21/16 cm water pressure. I reviewed PAP compliance expectations with the pt. Pt is agreeable to starting a BiPAP. I advised pt that an order will be sent to the [place] and [place] will call the pt within about one week after they file with the pt's insurance. [facility] will show the pt how to use the machine, fit for masks, and troubleshoot the CPAP if needed. A follow up appt was made for insurance purposes with Dr. Vickey Huger or through the [place] 61-90 days. Pt requested a copy of the studies be sent to his home address. Verified address on file. Pt verbalized understanding. Pt had no questions at this time but was encouraged to call back if questions arise.

## 2020-10-09 ENCOUNTER — Emergency Department (HOSPITAL_COMMUNITY): Payer: No Typology Code available for payment source

## 2020-10-09 ENCOUNTER — Emergency Department (HOSPITAL_COMMUNITY)
Admission: EM | Admit: 2020-10-09 | Discharge: 2020-10-09 | Disposition: A | Discharge status: Home / Self Care | Payer: No Typology Code available for payment source | Attending: Emergency Medicine | Admitting: Emergency Medicine

## 2020-10-09 ENCOUNTER — Encounter (HOSPITAL_COMMUNITY): Payer: Self-pay | Admitting: Emergency Medicine

## 2020-10-09 ENCOUNTER — Other Ambulatory Visit: Payer: Self-pay

## 2020-10-09 DIAGNOSIS — I10 Essential (primary) hypertension: Secondary | ICD-10-CM | POA: Diagnosis not present

## 2020-10-09 DIAGNOSIS — R350 Frequency of micturition: Secondary | ICD-10-CM | POA: Insufficient documentation

## 2020-10-09 DIAGNOSIS — Z20822 Contact with and (suspected) exposure to covid-19: Secondary | ICD-10-CM | POA: Diagnosis not present

## 2020-10-09 DIAGNOSIS — R531 Weakness: Secondary | ICD-10-CM | POA: Insufficient documentation

## 2020-10-09 DIAGNOSIS — N390 Urinary tract infection, site not specified: Secondary | ICD-10-CM

## 2020-10-09 DIAGNOSIS — Z96651 Presence of right artificial knee joint: Secondary | ICD-10-CM | POA: Insufficient documentation

## 2020-10-09 DIAGNOSIS — R509 Fever, unspecified: Secondary | ICD-10-CM | POA: Insufficient documentation

## 2020-10-09 LAB — CBC WITH DIFFERENTIAL/PLATELET
Abs Immature Granulocytes: 0.13 10*3/uL — ABNORMAL HIGH (ref 0.00–0.07)
Basophils Absolute: 0 10*3/uL (ref 0.0–0.1)
Basophils Relative: 0 %
Eosinophils Absolute: 0 10*3/uL (ref 0.0–0.5)
Eosinophils Relative: 0 %
HCT: 41.4 % (ref 39.0–52.0)
Hemoglobin: 13.6 g/dL (ref 13.0–17.0)
Immature Granulocytes: 1 %
Lymphocytes Relative: 9 %
Lymphs Abs: 1.7 10*3/uL (ref 0.7–4.0)
MCH: 32.3 pg (ref 26.0–34.0)
MCHC: 32.9 g/dL (ref 30.0–36.0)
MCV: 98.3 fL (ref 80.0–100.0)
Monocytes Absolute: 1.3 10*3/uL — ABNORMAL HIGH (ref 0.1–1.0)
Monocytes Relative: 7 %
Neutro Abs: 14.8 10*3/uL — ABNORMAL HIGH (ref 1.7–7.7)
Neutrophils Relative %: 83 %
Platelets: 248 10*3/uL (ref 150–400)
RBC: 4.21 MIL/uL — ABNORMAL LOW (ref 4.22–5.81)
RDW: 12.9 % (ref 11.5–15.5)
WBC: 18 10*3/uL — ABNORMAL HIGH (ref 4.0–10.5)
nRBC: 0 % (ref 0.0–0.2)

## 2020-10-09 LAB — COMPREHENSIVE METABOLIC PANEL
ALT: 23 U/L (ref 0–44)
AST: 25 U/L (ref 15–41)
Albumin: 4.2 g/dL (ref 3.5–5.0)
Alkaline Phosphatase: 64 U/L (ref 38–126)
Anion gap: 11 (ref 5–15)
BUN: 20 mg/dL (ref 8–23)
CO2: 23 mmol/L (ref 22–32)
Calcium: 8.6 mg/dL — ABNORMAL LOW (ref 8.9–10.3)
Chloride: 98 mmol/L (ref 98–111)
Creatinine, Ser: 1.19 mg/dL (ref 0.61–1.24)
GFR, Estimated: >60 mL/min (ref >60)
Glucose, Bld: 127 mg/dL — ABNORMAL HIGH (ref 70–99)
Potassium: 3.1 mmol/L — ABNORMAL LOW (ref 3.5–5.1)
Sodium: 132 mmol/L — ABNORMAL LOW (ref 135–145)
Total Bilirubin: 1.1 mg/dL (ref 0.3–1.2)
Total Protein: 8 g/dL (ref 6.5–8.1)

## 2020-10-09 LAB — URINALYSIS, ROUTINE W REFLEX MICROSCOPIC
Bilirubin Urine: NEGATIVE
Glucose, UA: NEGATIVE mg/dL
Ketones, ur: 20 mg/dL — AB
Nitrite: NEGATIVE
Protein, ur: 100 mg/dL — AB
Specific Gravity, Urine: 1.025 (ref 1.005–1.030)
WBC, UA: >50 WBC/hpf — ABNORMAL HIGH (ref 0–5)
pH: 5 (ref 5.0–8.0)

## 2020-10-09 LAB — RESP PANEL BY RT-PCR (FLU A&B, COVID) ARPGX2
Influenza A by PCR: NEGATIVE
Influenza B by PCR: NEGATIVE
SARS Coronavirus 2 by RT PCR: NEGATIVE

## 2020-10-09 LAB — LACTIC ACID, PLASMA
Lactic Acid, Venous: 1.8 mmol/L (ref 0.5–1.9)
Lactic Acid, Venous: 2.6 mmol/L (ref 0.5–1.9)

## 2020-10-09 MED ORDER — ACETAMINOPHEN 325 MG PO TABS
650.0000 mg | ORAL_TABLET | Freq: Once | ORAL | Status: AC
  Administered 2020-10-09: 650 mg via ORAL
  Filled 2020-10-09: qty 2

## 2020-10-09 MED ORDER — LACTATED RINGERS IV BOLUS
1000.0000 mL | Freq: Once | INTRAVENOUS | Status: AC
  Administered 2020-10-09: 1000 mL via INTRAVENOUS

## 2020-10-09 MED ORDER — CEPHALEXIN 500 MG PO CAPS: 500.0000 mg | ORAL_CAPSULE | Freq: Three times a day (TID) | ORAL | 0 refills | Status: AC

## 2020-10-09 MED ORDER — SODIUM CHLORIDE 0.9 % IV SOLN
1.0000 g | Freq: Once | INTRAVENOUS | Status: AC
  Administered 2020-10-09: 1 g via INTRAVENOUS
  Filled 2020-10-09: qty 10

## 2020-10-09 NOTE — ED Provider Notes (Signed)
Emergency Medicine Provider Triage Evaluation Note  Christian Nicholson , a 70 y.o. male  was evaluated in triage.  Pt complains of urinary incontinence and fatigue began yesterday denies NVD/CP/SOB. States he's felt more sweaty out in the heat than usual this past week.  Review of Systems  Positive: Urinary incontinence  Negative: Fever  Physical Exam  BP (!) 155/76 (BP Location: Left Arm)   Pulse 88   Temp 98.2 F (36.8 C) (Oral)   Resp 18   Ht 5\' 10"  (1.778 m)   Wt 112 kg   SpO2 94%   BMI 35.44 kg/m  Gen:   Awake, no distress   Resp:  Normal effort  MSK:   Moves extremities without difficulty  Other:  Abd soft non-tender  Medical Decision Making  Medically screening exam initiated at 10:44 AM.  Appropriate orders placed.  Christian Nicholson was informed that the remainder of the evaluation will be completed by another provider, this initial triage assessment does not replace that evaluation, and the importance of remaining in the ED until their evaluation is complete.  No issues walking. Denies back pain.  No fevers.       Betsey Holiday [place], DOLE 10/09/20 1047    [place], 10/11/20, MD 10/09/20 [phone]

## 2020-10-09 NOTE — ED Provider Notes (Signed)
[facility] EMERGENCY DEPARTMENT Provider Note  CSN: [id number] Arrival date & time: 10/09/20 7619    History Chief Complaint  Patient presents with  . Urinary Incontinence  . Fever    Ulysse Keysor is a 70 y.o. male with no significant PMH reports 2 days of chills, urinary frequency/urgency and incontinence with general weakness. He has not had any abdominal pain, vomiting, flank pain, penile discharge or testicular pain/swelling. No concern for STI.    Past Medical History:  Diagnosis Date  . Constipation   . Depression   . Glaucoma 12/01/2016  . Hyperlipidemia 12/01/2016  . Hypertension   . OSA on CPAP 10/2013  . Osteoarthritis of knee   . 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 TOTAL KNEE ARTHROPLASTY Right 11/25/2016   Dr. Barkley Boards    Family History  Problem Relation Age of Onset  . Cancer Mother   . Cancer Father   . Heart disease Maternal Grandmother   . Heart disease Paternal Grandmother     Social History   Tobacco Use  . Smoking status: Former    Types: Cigarettes  . Smokeless tobacco: Never  Vaping Use  . Vaping Use: Never used  Substance Use Topics  . Alcohol use: No  . Drug use: No     Home Medications Prior to Admission medications   Medication Sig Start Date End Date Taking? Authorizing Provider  latanoprost (XALATAN) 0.005 % ophthalmic solution 1 drop. In both eyes at bedtime for glaucoma    [provider]  timolol (BETIMOL) 0.5 % ophthalmic solution 1 drop. In right eye once daily for glaucoma    [provider]  zolpidem (AMBIEN) 10 MG tablet Take 10 mg by mouth at bedtime as needed for sleep.    [provider]     Allergies    Patient has no known allergies.   Review of Systems   Review of Systems A comprehensive review of systems was completed and negative except as noted in HPI.    Physical Exam BP (!) 194/95   Pulse  (!) 105   Temp (!) 100.7 F (38.2 C) (Oral)   Resp 18   Ht 5\' 10"  (1.778 m)   Wt 112 kg   SpO2 100%   BMI 35.44 kg/m   Physical Exam Vitals and nursing note reviewed.  Constitutional:      Appearance: Normal appearance.     Comments: Shaking chills  HENT:     Head: Normocephalic and atraumatic.     Nose: Nose normal.     Mouth/Throat:     Mouth: Mucous membranes are moist.  Eyes:     Extraocular Movements: Extraocular movements intact.     Conjunctiva/sclera: Conjunctivae normal.  Cardiovascular:     Rate and Rhythm: Normal rate.  Pulmonary:     Effort: Pulmonary effort is normal.     Breath sounds: Normal breath sounds.  Abdominal:     General: Abdomen is flat.     Palpations: Abdomen is soft.     Tenderness: There is no abdominal tenderness.  Genitourinary:    Penis: Normal.      Testes: Normal.  Musculoskeletal:        General: No swelling. Normal range of motion.     Cervical back: Neck supple.  Skin:    General: Skin is warm and dry.  Neurological:     General: No focal deficit present.  Mental Status: He is alert and oriented to person, place, and time.  Psychiatric:        Mood and Affect: Mood normal.     ED Results / Procedures / Treatments   Labs (all labs ordered are listed, but only abnormal results are displayed) Labs Reviewed  COMPREHENSIVE METABOLIC PANEL - Abnormal; Notable for the following components:      Result Value   Sodium 132 (*)    Potassium 3.1 (*)    Glucose, Bld 127 (*)    Calcium 8.6 (*)    All other components within normal limits  CBC WITH DIFFERENTIAL/PLATELET - Abnormal; Notable for the following components:   WBC 18.0 (*)    RBC 4.21 (*)    Neutro Abs 14.8 (*)    Monocytes Absolute 1.3 (*)    Abs Immature Granulocytes 0.13 (*)    All other components within normal limits  RESP PANEL BY RT-PCR (FLU A&B, COVID) [id number]  LACTIC ACID, PLASMA  URINALYSIS, ROUTINE W REFLEX MICROSCOPIC  LACTIC ACID, PLASMA  LACTIC  ACID, PLASMA    EKG None  Radiology DG Chest 2 View  Result Date: 10/09/2020 CLINICAL DATA:  Fever, urinary incontinence, fatigue EXAM: CHEST - 2 VIEW COMPARISON:  None. FINDINGS: The cardiomediastinal silhouette is within normal limits. The lungs are clear, with no focal consolidation or pulmonary edema. There is no pleural effusion or pneumothorax. The bones are unremarkable. IMPRESSION: No radiographic evidence of acute cardiopulmonary process. Electronically Signed   By: Lesia Hausen M.D.   On: 10/09/2020 11:24    Procedures Procedures  Medications Ordered in the ED Medications  lactated ringers bolus 1,000 mL (has no administration in time range)  acetaminophen (TYLENOL) tablet 650 mg (has no administration in time range)     MDM Rules/Calculators/A&P MDM Patient labs drawn on arrival in triage showed normal lactic acid but WBC was elevate at 18. UA is still pending. CMP with mild hypokalemia, Covid/Flu neg. Patient now febrile and tachycardic. Will add repeat Lactic acid, begin LR and Abx if UA confirmed as source.   ED Course  I have reviewed the triage vital signs and the nursing notes.  Pertinent labs & imaging results that were available during my care of the patient were reviewed by me and considered in my medical decision making (see chart for details).  Clinical Course as of 10/09/20 2312  Tue Oct 09, 2020  7253 UA was inadvertently not run by lab but will be in process now. Temp and HR improved with APAP.  [CS]  1941 UA confirms urine source of his symptoms. Rocephin ordered. Lactic acid is increasing from labs drawn on arrival >9 hours ago.  [CS]  2016 Patient feeling better after APAP and wants to go home when Abx finished. Will send home with Rx for Keflex, recommend PCP follow up, RTED for any worsening fever, flank pain, vomiting or any other concerns.  [CS]    Clinical Course User Index [CS] Pollyann Savoy, MD    Final Clinical Impression(s) / ED  Diagnoses Final diagnoses:  None    Rx / DC Orders ED Discharge Orders     None        Pollyann Savoy, MD 10/09/20 2312

## 2020-10-09 NOTE — ED Notes (Signed)
Critical lactic acid 2.6.

## 2020-10-09 NOTE — ED Triage Notes (Signed)
Complains of fever, urinary incontinence and fatigue since yesterday, states he has been bursting out in sweats for about a week now. Diaphoretic in triage. Denies SOB, SP, abd pain. Denies back pain, falls or recent trauma. Denies dysuria.

## 2022-07-31 IMAGING — CR DG CHEST 2V
2 series · 2 of 2 positions shown · non-contrast
Comparison: None.

CLINICAL DATA: Fever, urinary incontinence, fatigue

EXAM:
CHEST - 2 VIEW

[w chest pa]
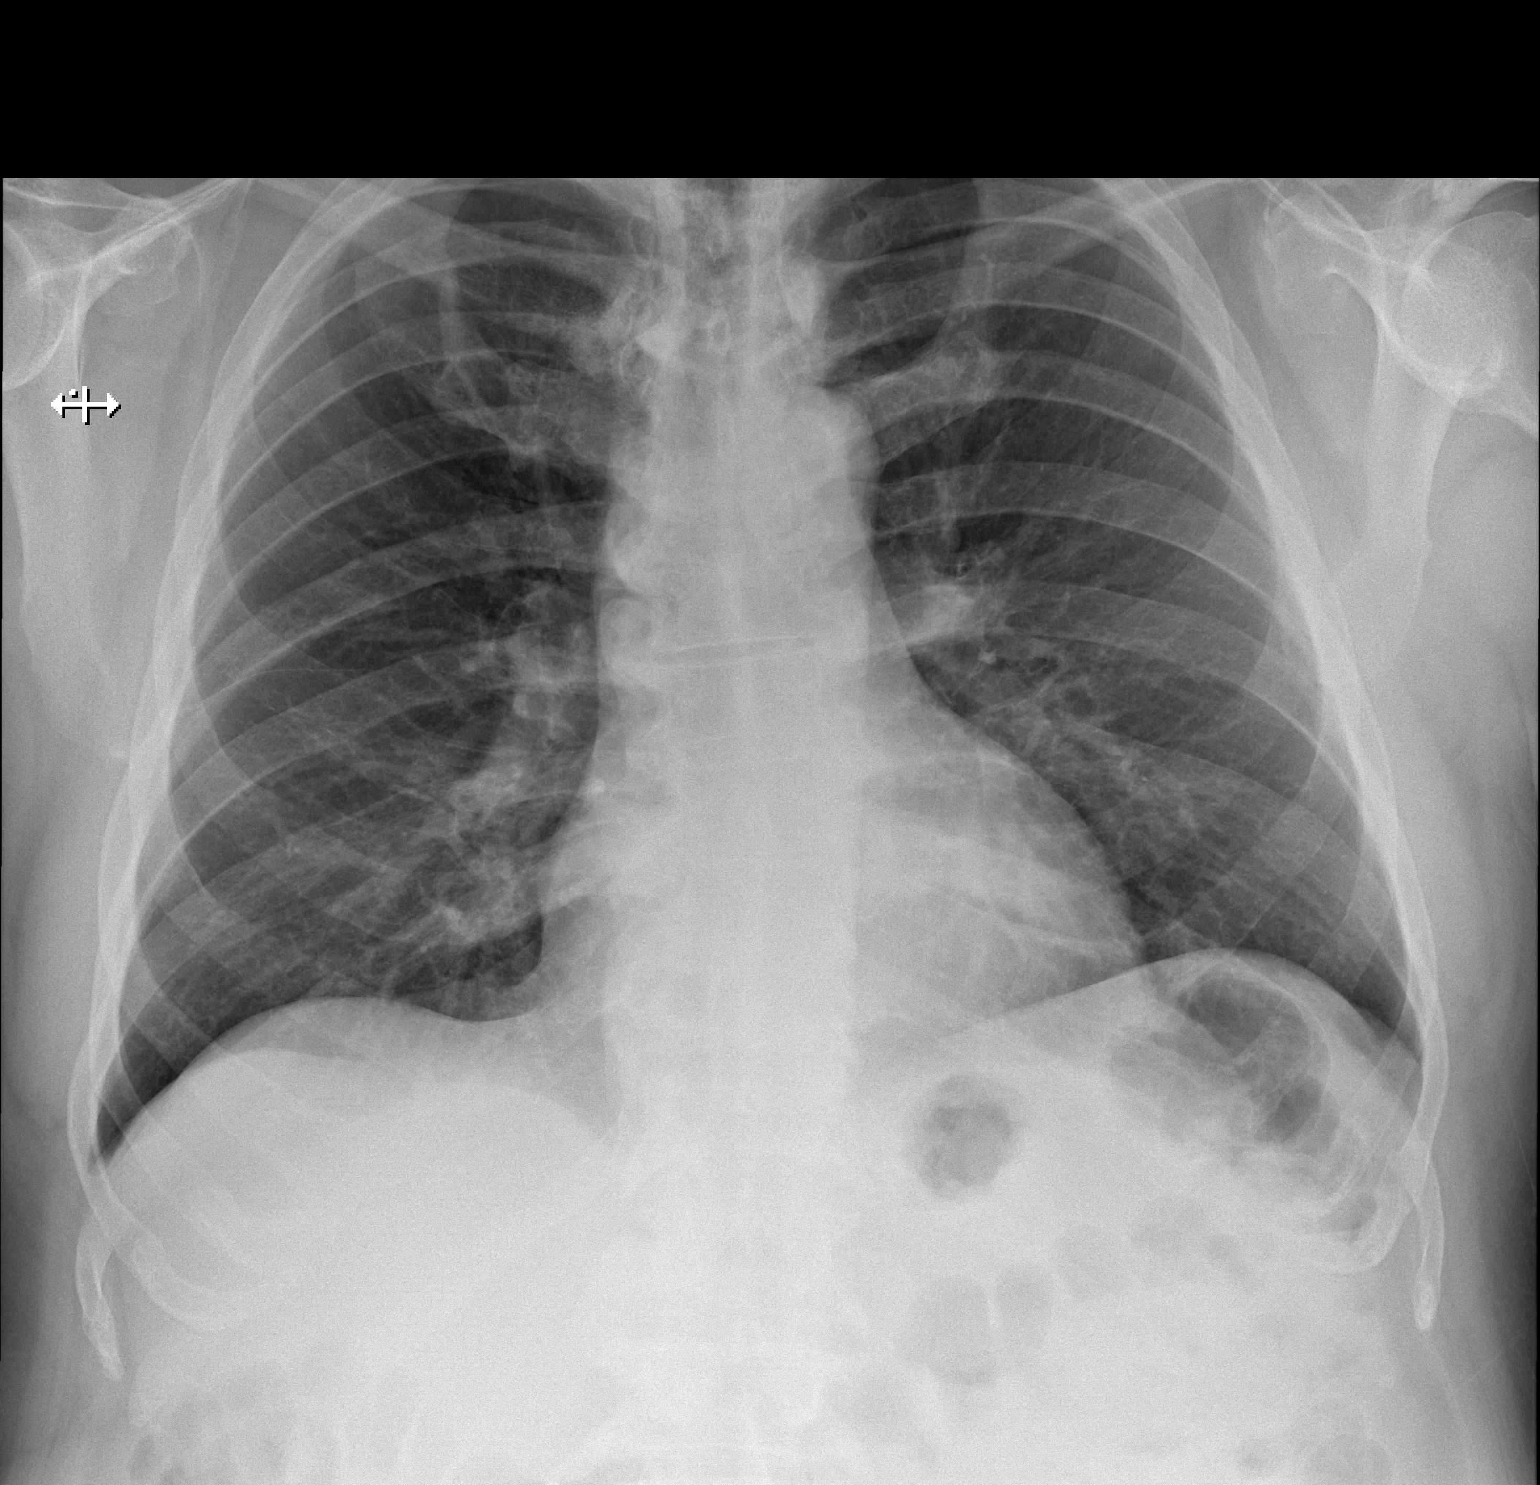

[w chest lat]
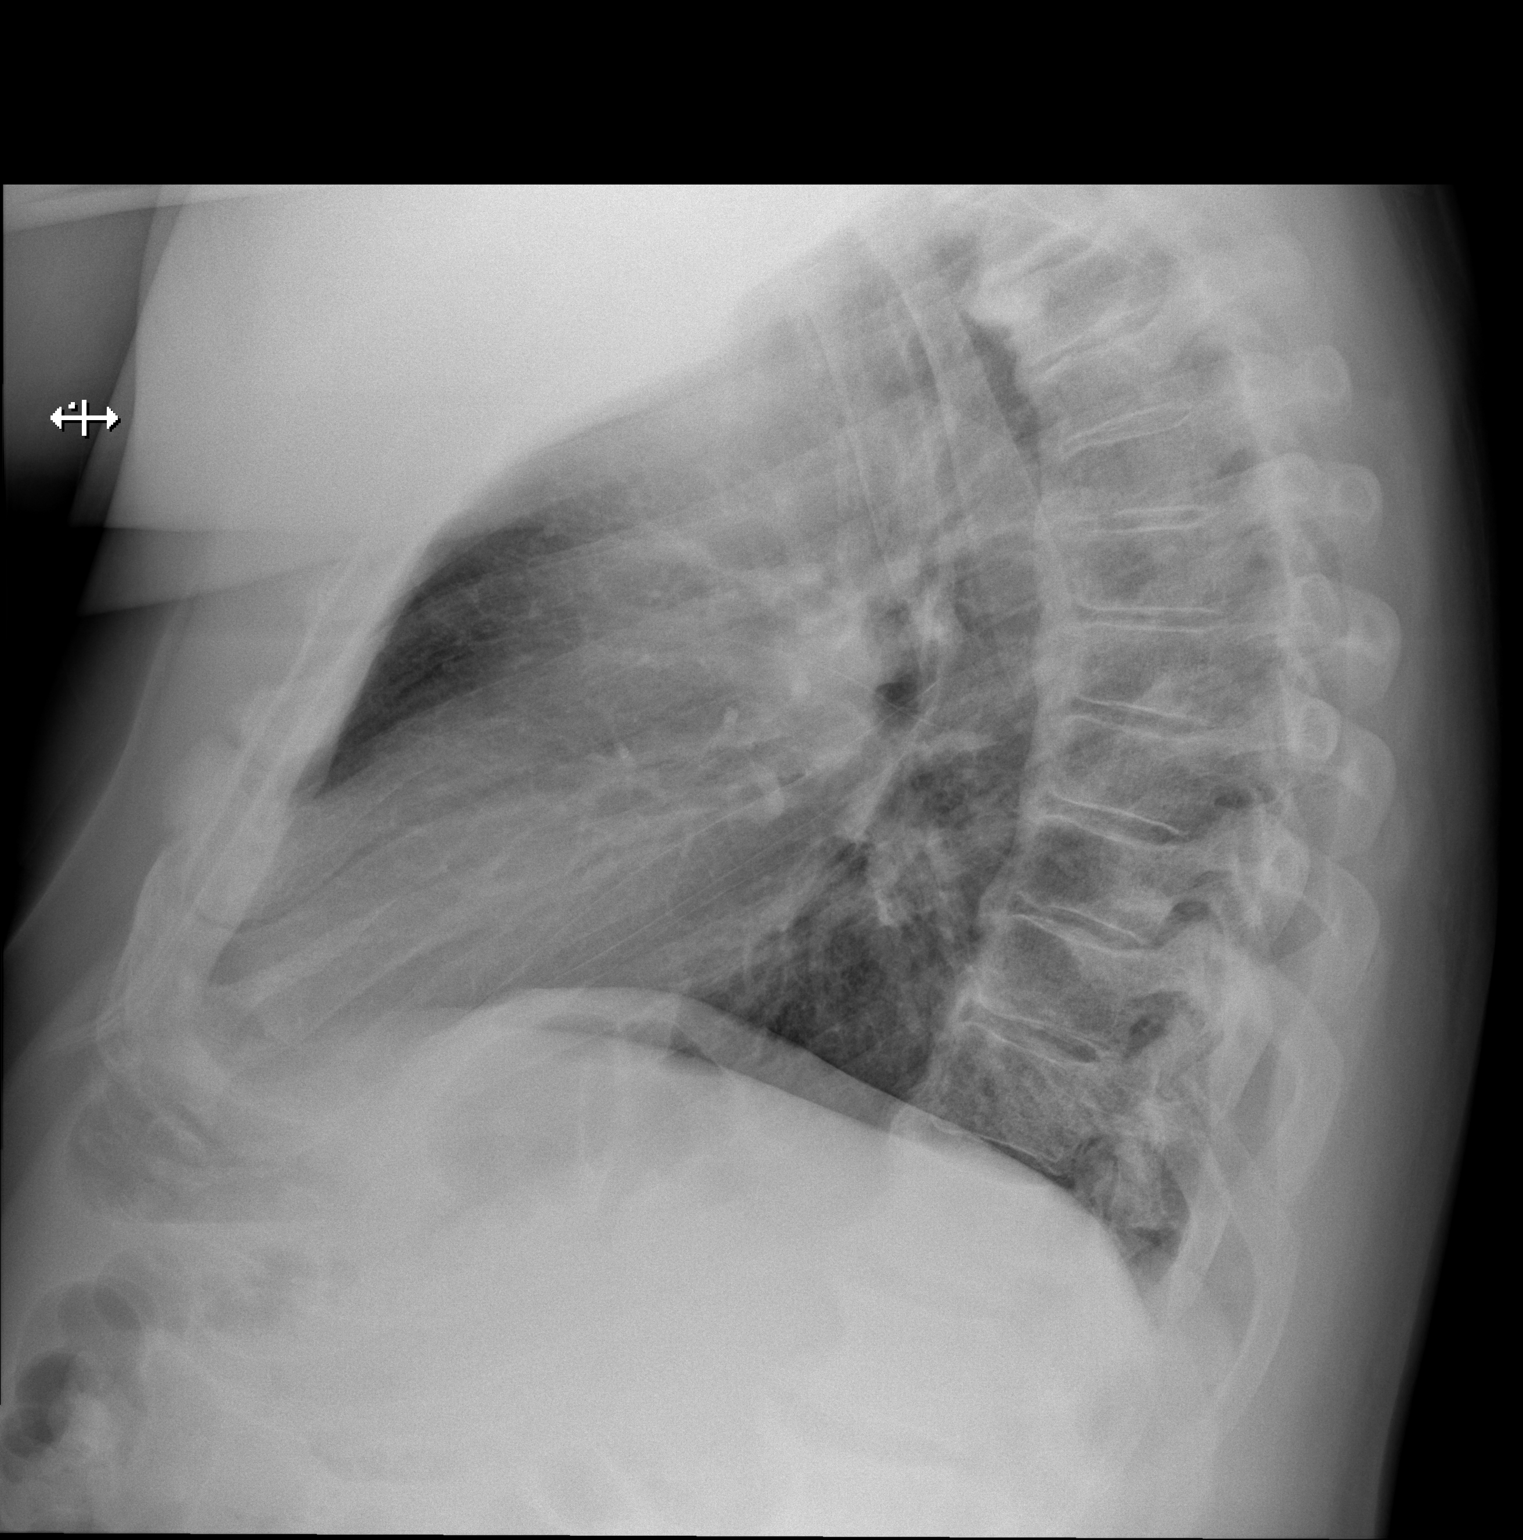

[2 of 2 positions shown; findings below may reference images not displayed]

FINDINGS: The cardiomediastinal silhouette is within normal limits.

The lungs are clear, with no focal consolidation or pulmonary edema.
There is no pleural effusion or pneumothorax.

The bones are unremarkable.
IMPRESSION: No radiographic evidence of acute cardiopulmonary process.
